# Patient Record
Sex: Male | Born: 2000 | Race: White | Hispanic: No | Marital: Single | Smoking: Never smoker
Health system: Southern US, Community
[De-identification: ages and names within clinical notes are randomized; demographics above are authoritative.]

## PROBLEM LIST (undated history)

## (undated) DIAGNOSIS — F909 Attention-deficit hyperactivity disorder, unspecified type: Secondary | ICD-10-CM

## (undated) DIAGNOSIS — G43909 Migraine, unspecified, not intractable, without status migrainosus: Secondary | ICD-10-CM

## (undated) HISTORY — DX: Migraine, unspecified, not intractable, without status migrainosus: G43.909

---
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
## 2018-02-02 ENCOUNTER — Ambulatory Visit: Payer: Managed Care, Other (non HMO) | Admitting: Family Medicine

## 2018-02-02 ENCOUNTER — Other Ambulatory Visit: Payer: Self-pay

## 2018-02-02 ENCOUNTER — Encounter: Payer: Self-pay | Admitting: Family Medicine

## 2018-02-02 VITALS — BP 122/80 | HR 92 | Temp 99.3°F | Ht 70.25 in | Wt 151.6 lb

## 2018-02-02 DIAGNOSIS — F988 Other specified behavioral and emotional disorders with onset usually occurring in childhood and adolescence: Secondary | ICD-10-CM | POA: Diagnosis not present

## 2018-02-02 MED ORDER — LISDEXAMFETAMINE DIMESYLATE 50 MG PO CAPS: 50.0000 mg | ORAL_CAPSULE | Freq: Every day | ORAL | 0 refills | Status: DC

## 2018-02-02 NOTE — Progress Notes (Signed)
Subjective  CC:  Chief Complaint  Patient presents with  . Establish Care    Transfer from [facility], Last Physical 09/2016, declines flu shot  . ADHD    Needs Refills on Vyvanse.     HPI: Logan Tucker is a 17 y.o. male is a former [facility] patient and is here to reestablish care with me today.    He has the following concerns or needs:  Logan Tucker is now a high school grad! He is doing well. Living with mom and dad. Hoping to find full time work for one year, then apply to Research officer, trade [employer].   Doing well on vyvanse. Has been stable on this dose for several years. No concerns.   HM: due for CPE and flu and meningitis B vaccine. Pt defers both today.   Assessment  1. Attention deficit disorder (ADD) without hyperactivity      Plan   Refilled meds. This medical condition is well controlled. There are no signs of complications, medication side effects, or red flags. Patient is instructed to continue the current treatment plan without change in therapies or medications.   Return for cpe and vaccinations. Educational ho given on bexsero for mom/dad to review with pt.   Follow up:  3 months for cpe and ADD f/u  No orders of the defined types were placed in this encounter.  Meds ordered this encounter  Medications  . DISCONTD: lisdexamfetamine (VYVANSE) 50 MG capsule    Sig: Take 1 capsule (50 mg total) by mouth daily.    Dispense:  30 capsule    Refill:  0  . DISCONTD: lisdexamfetamine (VYVANSE) 50 MG capsule    Sig: Take 1 capsule (50 mg total) by mouth daily.    Dispense:  30 capsule    Refill:  0  . lisdexamfetamine (VYVANSE) 50 MG capsule    Sig: Take 1 capsule (50 mg total) by mouth daily.    Dispense:  30 capsule    Refill:  0      We updated and reviewed the patient's past history in detail and it is documented below.  Patient Active Problem List   Diagnosis Date Noted  . Acne vulgaris 01/29/2016  . ADD (attention deficit disorder) 03/13/2011   Health Maintenance  Topic  Date Due  . HIV Screening  03/13/2014  . INFLUENZA VACCINE  12/31/2017   Immunization History  Administered Date(s) Administered  . DTaP 05/10/1999, 07/11/1999, 09/20/1999, 03/25/2000, 12/28/2003  . HPV Quadrivalent 08/08/2010, 03/20/2011, 06/10/2012  . Hepatitis A, Ped/Adol-2 Dose 08/08/2010, 03/20/2011  . Hepatitis B, ped/adol [birth year]1998, 05/10/1999, 09/20/1999  . HiB (PRP-OMP) 05/10/1999, 07/11/1999, 09/20/1999, 03/25/2000  . IPV 05/10/1999, 07/11/1999, 09/20/1999, 12/28/2003  . Influenza, Seasonal, Injecte, Preservative Fre 06/05/2014  . Influenza,inj,Quad PF,6+ Mos 03/23/2016, 04/09/2017  . Influenza,trivalent, recombinat, inj, PF 03/13/2011  . Influenza-Unspecified 04/15/2000, 03/02/2012, 03/26/2015  . MMR 03/25/2000, 12/28/2003  . Meningococcal Conjugate 08/08/2010, 07/27/2015  . Pneumococcal Conjugate-13 05/10/1999, 07/11/1999, 09/20/1999  . Td 08/08/2010  . Tdap 08/08/2010  . Varicella 03/25/2000, 08/08/2010   Current Meds  Medication Sig  . [START ON 04/03/2018] lisdexamfetamine (VYVANSE) 50 MG capsule Take 1 capsule (50 mg total) by mouth daily.  . [DISCONTINUED] lisdexamfetamine (VYVANSE) 50 MG capsule Take 50 mg by mouth daily.  . [DISCONTINUED] lisdexamfetamine (VYVANSE) 50 MG capsule Take 1 capsule (50 mg total) by mouth daily.  . [DISCONTINUED] lisdexamfetamine (VYVANSE) 50 MG capsule Take 1 capsule (50 mg total) by mouth daily.    Allergies: Patient has No Known  Allergies. Past Medical History Patient  has a past medical history of ADHD (attention deficit hyperactivity disorder) and Migraines. Past Surgical History Patient  has no past surgical history on file. Family History: Patient family history includes COPD in his maternal grandmother and paternal grandmother; Depression in his paternal grandfather; Diabetes in his maternal grandmother; Heart disease in his father and paternal grandfather; Hypertension in his father, maternal grandmother, mother, and  paternal grandfather. Social History:  Patient  reports that he has never smoked. He has never used smokeless tobacco. He reports that he does not use drugs.  Review of Systems: Constitutional: negative for fever or malaise Ophthalmic: negative for photophobia, double vision or loss of vision Cardiovascular: negative for chest pain, dyspnea on exertion, or new LE swelling Respiratory: negative for SOB or persistent cough Gastrointestinal: negative for abdominal pain, change in bowel habits or melena Genitourinary: negative for dysuria or gross hematuria Musculoskeletal: negative for new gait disturbance or muscular weakness Integumentary: negative for new or persistent rashes Neurological: negative for TIA or stroke symptoms Psychiatric: negative for SI or delusions Allergic/Immunologic: negative for hives  Patient Care Team    Relationship Specialty Notifications Start End  Leamon Arnt, MD PCP - General Family Medicine  02/02/18     Objective  Vitals: BP 122/80   Pulse 92   Temp 99.3 F (37.4 C)   Ht 5' 10.25" (1.784 m)   Wt 151 lb 9.6 oz (68.8 kg)   SpO2 98%   BMI 21.60 kg/m  General:  Well developed, well nourished, no acute distress  Psych:  Alert and oriented,normal mood and affect Cardiovascular:  RRR without gallop, rub or murmur, nondisplaced PMI Respiratory:  Good breath sounds bilaterally, CTAB with normal respiratory effort Neurologic:    Mental status is normal. Gross motor and sensory exams are normal. Normal gait  Commons side effects, risks, benefits, and alternatives for medications and treatment plan prescribed today were discussed, and the patient expressed understanding of the given instructions. Patient is instructed to call or message via MyChart if he/she has any questions or concerns regarding our treatment plan. No barriers to understanding were identified. We discussed Red Flag symptoms and signs in detail. Patient expressed understanding regarding  what to do in case of urgent or emergency type symptoms.   Medication list was reconciled, printed and provided to the patient in AVS. Patient instructions and summary information was reviewed with the patient as documented in the AVS. This note was prepared with assistance of Dragon voice recognition software. Occasional wrong-word or sound-a-like substitutions may have occurred due to the inherent limitations of voice recognition software

## 2018-02-02 NOTE — Patient Instructions (Signed)
It was so good seeing you again! Thank you for establishing with my new practice and allowing me to continue caring for you. It means a lot to me.   Please schedule a follow up appointment with me in 3 months for for your annual complete physical; please come fasting. We will refill your ADD medications at that time as well.   Consider getting the Bexsero vaccination for meningitis B. See the informational handout attached.

## 2018-03-23 ENCOUNTER — Other Ambulatory Visit: Payer: Self-pay | Admitting: Family Medicine

## 2018-03-23 NOTE — Telephone Encounter (Signed)
Left message for pt's mother to return call to the office regarding refill. Noted in the chart that Vyvanse refill would be available on 04/03/18.

## 2018-03-23 NOTE — Telephone Encounter (Signed)
Copied from CRM [id number]. Topic: Quick Communication - Rx Refill/Question >> Mar 23, 2018 10:07 AM Luanna Cole wrote: Medication: lisdexamfetamine (VYVANSE) 50 MG capsule [[id number]]   Has the patient contacted their pharmacy?no  Preferred Pharmacy (with phone number or street name): [id number] [place], [place] - [address] AT [facility] [address] [facility] [address] [phone] (Phone) [phone] (Fax)    Agent: Please be advised that RX refills may take up to 3 business days. We ask that you follow-up with your pharmacy.

## 2018-03-25 NOTE — Telephone Encounter (Signed)
Patient found printed prescriptions.   Kathi Simpers,  LPN

## 2018-05-12 ENCOUNTER — Ambulatory Visit (INDEPENDENT_AMBULATORY_CARE_PROVIDER_SITE_OTHER): Payer: 59 | Admitting: Family Medicine

## 2018-05-12 ENCOUNTER — Other Ambulatory Visit: Payer: Self-pay

## 2018-05-12 ENCOUNTER — Other Ambulatory Visit (HOSPITAL_COMMUNITY)
Admission: RE | Admit: 2018-05-12 | Discharge: 2018-05-12 | Disposition: A | Discharge status: Home / Self Care | Payer: 59 | Source: Ambulatory Visit | Attending: Family Medicine | Admitting: Family Medicine

## 2018-05-12 ENCOUNTER — Encounter: Payer: Self-pay | Admitting: Family Medicine

## 2018-05-12 VITALS — BP 128/84 | HR 88 | Temp 98.6°F | Resp 16 | Ht 70.0 in | Wt 158.0 lb

## 2018-05-12 DIAGNOSIS — Z00129 Encounter for routine child health examination without abnormal findings: Principal | ICD-10-CM

## 2018-05-12 DIAGNOSIS — Z113 Encounter for screening for infections with a predominantly sexual mode of transmission: Secondary | ICD-10-CM

## 2018-05-12 DIAGNOSIS — Z0000 Encounter for general adult medical examination without abnormal findings: Secondary | ICD-10-CM | POA: Insufficient documentation

## 2018-05-12 DIAGNOSIS — F988 Other specified behavioral and emotional disorders with onset usually occurring in childhood and adolescence: Secondary | ICD-10-CM

## 2018-05-12 DIAGNOSIS — Z23 Encounter for immunization: Secondary | ICD-10-CM | POA: Diagnosis not present

## 2018-05-12 MED ORDER — LISDEXAMFETAMINE DIMESYLATE 50 MG PO CAPS: 50.0000 mg | ORAL_CAPSULE | Freq: Every day | ORAL | 0 refills | Status: DC

## 2018-05-12 NOTE — Patient Instructions (Signed)
Please return in 6 months For ADD follow up.  If you have any questions or concerns, please don't hesitate to send me a message via MyChart or call the office at [phone]. Thank you for visiting with us today! It's our pleasure caring for you.  Please do these things to maintain good health!   Exercise at least 30-45 minutes a day,  4-5 days a week.   Eat a low-fat diet with lots of fruits and vegetables, up to 7-9 servings per day.  Drink plenty of water daily. Try to drink 8 8oz glasses per day.  Seatbelts can save your life. Always wear your seatbelt.  Place Smoke Detectors on every level of your home and check batteries every year.  Eye Doctor - have an eye exam every 1-2 years  Safe sex - use condoms to protect yourself from STDs if you could be exposed to these types of infections.  No texting and driving  Depression is common in our stressful world.If you're feeling down or losing interest in things you normally enjoy, please come in for a visit.

## 2018-05-12 NOTE — Progress Notes (Signed)
Subjective:    Chief Complaint  Patient presents with  . Annual Exam     History was provided by the patient. Due bexsero vaccine.  Logan Tucker is a 17 y.o. male who is here for this well-child visit.  F/u ADD: doing well on vyvanse. Immunization History  Administered Date(s) Administered  . DTaP 05/10/1999, 07/11/1999, 09/20/1999, 03/25/2000, 12/28/2003  . HPV Quadrivalent 08/08/2010, 03/20/2011, 06/10/2012  . Hepatitis A, Ped/Adol-2 Dose 08/08/2010, 03/20/2011  . Hepatitis B, ped/adol Aug [birth year], 1998, 05/10/1999, 09/20/1999  . HiB (PRP-OMP) 05/10/1999, 07/11/1999, 09/20/1999, 03/25/2000  . IPV 05/10/1999, 07/11/1999, 09/20/1999, 12/28/2003  . Influenza Inj Mdck Quad Pf 05/04/2018  . Influenza, Seasonal, Injecte, Preservative Fre 06/05/2014  . Influenza,inj,Quad PF,6+ Mos 03/23/2016, 04/09/2017  . Influenza,trivalent, recombinat, inj, PF 03/13/2011  . Influenza-Unspecified 04/15/2000, 03/02/2012, 03/26/2015  . MMR 03/25/2000, 12/28/2003  . Meningococcal Conjugate 08/08/2010, 07/27/2015  . Pneumococcal Conjugate-13 05/10/1999, 07/11/1999, 09/20/1999  . Td 08/08/2010  . Tdap 08/08/2010  . Varicella 03/25/2000, 08/08/2010   The following portions of the patient's history were reviewed and updated as appropriate: allergies, current medications, past family history, past medical history, past social history, past surgical history and problem list.  Current Issues: Current concerns include none. Working at [employer]. To apply to [employer] soon. Happy. Has a girlfriend, monogamous. First.  Sexually active? yes - using condoms. No sxs of std; not using other form of birth control  Does patient snore? no   Review of Nutrition: Current diet: good appetite Balanced diet? yes  Social Screening:  Parental relations: excellent Sibling relations: only child Discipline concerns? no Concerns regarding behavior with peers? no School performance: graduated Secondhand smoke exposure?  no  Screening Questions: Risk factors for anemia: no Risk factors for vision problems: no Risk factors for hearing problems: no Risk factors for tuberculosis: no Risk factors for dyslipidemia: no Risk factors for sexually-transmitted infections: no Risk factors for alcohol/drug use:  no    Objective:    There were no vitals filed for this visit. Growth parameters are noted and are appropriate for age.  General:   alert, cooperative and no distress  Gait:   normal  Skin:   normal  Oral cavity:   lips, mucosa, and tongue normal; teeth and gums normal  Eyes:   sclerae white, pupils equal and reactive, red reflex normal bilaterally  Ears:   normal bilaterally  Neck:   no adenopathy, no carotid bruit, no JVD, supple, symmetrical, trachea midline and thyroid not enlarged, symmetric, no tenderness/mass/nodules  Lungs:  clear to auscultation bilaterally  Heart:   regular rate and rhythm, S1, S2 normal, no murmur, click, rub or gallop  Abdomen:  soft, non-tender; bowel sounds normal; no masses,  no organomegaly  GU:  exam deferred     Extremities:  extremities normal, atraumatic, no cyanosis or edema  Neuro:  normal without focal findings, mental status, speech normal, alert and oriented x3, PERLA and reflexes normal and symmetric    Assessment:     ICD-10-CM   1. Well adolescent visit Z00.129   2. 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 Anticipatory guidance discussed. Gave handout on well-child issues at this age. Specific topics reviewed: drugs, ETOH, and tobacco, importance of regular dental care, importance of regular exercise, importance of varied diet, limit TV, media violence, minimize junk food, seat belts and sex. STD screen. He declines HIV; this is reasonable. Recommend other form of birth control as  well: to discuss with his girlfriend.   2.  Weight management:  The patient was counseled regarding nutrition and physical  activity.  3. Development: appropriate for age  110. Immunizations today: per orders. bexsero today #1 History of previous adverse reactions to immunizations? no 5. ADD is well controlled. Gave 6 months of printed RXs.  Follow-up visit in 1 year for next well child visit, 6 months for ADD f/u.

## 2018-05-13 LAB — URINE CYTOLOGY ANCILLARY ONLY
CHLAMYDIA, DNA PROBE: NEGATIVE
Neisseria Gonorrhea: NEGATIVE

## 2018-05-14 NOTE — Progress Notes (Signed)
Please call patient: (only speak with patient, not parent please) I have reviewed his/her lab results. Urine tests are negative for infection.

## 2018-08-23 ENCOUNTER — Other Ambulatory Visit: Payer: Self-pay | Admitting: Emergency Medicine

## 2018-08-23 DIAGNOSIS — F988 Other specified behavioral and emotional disorders with onset usually occurring in childhood and adolescence: Secondary | ICD-10-CM

## 2018-08-23 MED ORDER — LISDEXAMFETAMINE DIMESYLATE 50 MG PO CAPS: 50.0000 mg | ORAL_CAPSULE | Freq: Every day | ORAL | 0 refills | Status: DC

## 2018-08-23 NOTE — Telephone Encounter (Signed)
Medication refilled.  No OV due to Covid 19 current restrictions.   

## 2018-08-23 NOTE — Telephone Encounter (Signed)
Vyvanse last rx 05/12/18 Per last visit was given 6 months rx LOV: 05/12/18 [facility]  Please advise of refill

## 2018-11-09 ENCOUNTER — Ambulatory Visit: Payer: 59 | Admitting: Family Medicine

## 2018-11-23 ENCOUNTER — Other Ambulatory Visit: Payer: Self-pay

## 2018-11-23 ENCOUNTER — Encounter: Payer: Self-pay | Admitting: Family Medicine

## 2018-11-23 ENCOUNTER — Ambulatory Visit: Payer: 59 | Admitting: Family Medicine

## 2018-11-23 VITALS — BP 116/74 | HR 95 | Temp 98.7°F | Resp 16 | Ht 70.0 in | Wt 164.2 lb

## 2018-11-23 DIAGNOSIS — Z23 Encounter for immunization: Secondary | ICD-10-CM

## 2018-11-23 DIAGNOSIS — F988 Other specified behavioral and emotional disorders with onset usually occurring in childhood and adolescence: Secondary | ICD-10-CM

## 2018-11-23 MED ORDER — LISDEXAMFETAMINE DIMESYLATE 50 MG PO CAPS: 50.0000 mg | ORAL_CAPSULE | Freq: Every day | ORAL | 0 refills | Status: DC

## 2018-11-23 MED ORDER — LISDEXAMFETAMINE DIMESYLATE 50 MG PO CAPS: 50.0000 mg | ORAL_CAPSULE | Freq: Every day | ORAL | 0 refills | Status: AC

## 2018-11-23 NOTE — Patient Instructions (Signed)
Please return in 6 months for your annual complete physical; please come fasting. and ADD f/u.  Today you were given your 2nd of 2 Bexsero (meningitis B) vaccination.   If you have any questions or concerns, please don't hesitate to send me a message via MyChart or call the office at [phone]. Thank you for visiting with [place] today! It's our pleasure caring for you.  Best of luck with school!!! I'm happy about that.

## 2018-11-23 NOTE — Progress Notes (Signed)
Subjective  CC:  Chief Complaint  Patient presents with  . ADD    Vyvanse working well    HPI: Logan Tucker is a 18 y.o. male who presents to the office today to address the problems listed above in the chief complaint.  Patient is here today for follow up of ADD/ADHD. He is taking medication as directed and continues to feel it is beneficial. The medications continue to help with focus and attention and task completion. He denies adverse side effects; specifically no headaches, appetite suppression, weight loss, sleeping difficulty, heart palpitations, chest pain or significant weight changes. Working at [employer]: applied to [employer].   This patient does not have contraindications for stimulant use include hypertension, tachycardia, arrhythmia, psychosis, bipolar disorder, severe anorexia, and Tourette syndrome.  Assessment  1. Attention deficit disorder (ADD) without hyperactivity   2. Need for meningococcal vaccination      Plan   ADD:  This medical condition is well controlled. There are no signs of complications, medication side effects, or red flags. Patient is instructed to continue the current treatment plan without change in therapies or medications.   Refilled meds; gave 5 written rXs.   Bexsero series completed today.  Follow up: No follow-ups on file.  Orders Placed This Encounter  Procedures  . Meningococcal B, OMV   Meds ordered this encounter  Medications  . DISCONTD: lisdexamfetamine (VYVANSE) 50 MG capsule    Sig: Take 1 capsule (50 mg total) by mouth daily.    Dispense:  30 capsule    Refill:  0  . DISCONTD: lisdexamfetamine (VYVANSE) 50 MG capsule    Sig: Take 1 capsule (50 mg total) by mouth daily.    Dispense:  30 capsule    Refill:  0  . DISCONTD: lisdexamfetamine (VYVANSE) 50 MG capsule    Sig: Take 1 capsule (50 mg total) by mouth daily.    Dispense:  30 capsule    Refill:  0  . DISCONTD: lisdexamfetamine (VYVANSE) 50 MG capsule   Sig: Take 1 capsule (50 mg total) by mouth daily.    Dispense:  30 capsule    Refill:  0  . DISCONTD: lisdexamfetamine (VYVANSE) 50 MG capsule    Sig: Take 1 capsule (50 mg total) by mouth daily.    Dispense:  30 capsule    Refill:  0  . lisdexamfetamine (VYVANSE) 50 MG capsule    Sig: Take 1 capsule (50 mg total) by mouth daily.    Dispense:  30 capsule    Refill:  0      I reviewed the patients updated PMH, FH, and SocHx.    Patient Active Problem List   Diagnosis Date Noted  . Acne vulgaris 01/29/2016  . ADD (attention deficit disorder) 03/13/2011   No outpatient medications have been marked as taking for the 11/23/18 encounter (Office Visit) with Willow OraAndy,  L, MD.    Allergies: Patient has No Known Allergies. Family History: Patient family history includes COPD in his maternal grandmother and paternal grandmother; Depression in his paternal grandfather; Diabetes in his maternal grandmother; Heart disease in his father and paternal grandfather; Hypertension in his father, maternal grandmother, mother, and paternal grandfather. Social History:  Patient  reports that he has never smoked. He has never used smokeless tobacco. He reports that he does not use drugs.  Review of Systems: Constitutional: Negative for fever malaise or anorexia Cardiovascular: negative for chest pain Respiratory: negative for SOB or persistent cough Gastrointestinal: negative for  abdominal pain  Objective  Vitals: BP 116/74   Pulse 95   Temp 98.7 F (37.1 C) (Oral)   Resp 16   Ht 5\' 10"  (1.778 m)   Wt 164 lb 3.2 oz (74.5 kg)   SpO2 97%   BMI 23.56 kg/m  General: no acute distress , A&Ox3 HEENT: PEERL, conjunctiva normal, Oropharynx moist,neck is supple Cardiovascular:  RRR without murmur or gallop.  Respiratory:  Good breath sounds bilaterally, CTAB with normal respiratory effort Skin:  Warm, no rashes    Commons side effects, risks, benefits, and alternatives for medications and  treatment plan prescribed today were discussed, and the patient expressed understanding of the given instructions. Patient is instructed to call or message via MyChart if he/she has any questions or concerns regarding our treatment plan. No barriers to understanding were identified. We discussed Red Flag symptoms and signs in detail. Patient expressed understanding regarding what to do in case of urgent or emergency type symptoms.   Medication list was reconciled, printed and provided to the patient in AVS. Patient instructions and summary information was reviewed with the patient as documented in the AVS. This note was prepared with assistance of Dragon voice recognition software. Occasional wrong-word or sound-a-like substitutions may have occurred due to the inherent limitations of voice recognition software

## 2019-05-25 ENCOUNTER — Ambulatory Visit: Payer: 59 | Admitting: Family Medicine
